# Patient Record
Sex: Female | Born: 1970 | Race: White | Hispanic: No
Health system: Southern US, Community
[De-identification: ages and names within clinical notes are randomized; demographics above are authoritative.]

---

## 2007-11-15 ENCOUNTER — Ambulatory Visit: Payer: Self-pay | Admitting: Family Medicine

## 2008-06-19 ENCOUNTER — Observation Stay: Payer: Self-pay | Admitting: Internal Medicine

## 2010-03-16 IMAGING — CR DG CHEST 2V
1 series · 2 of 2 positions shown · non-contrast
Comparison: none

REASON FOR EXAM: shortness of breath, asthma exac, ?pna
COMMENTS:

[Series 1: view not recorded · 0.17mm/px · 2 of 2 slices shown]
[im 1/2]
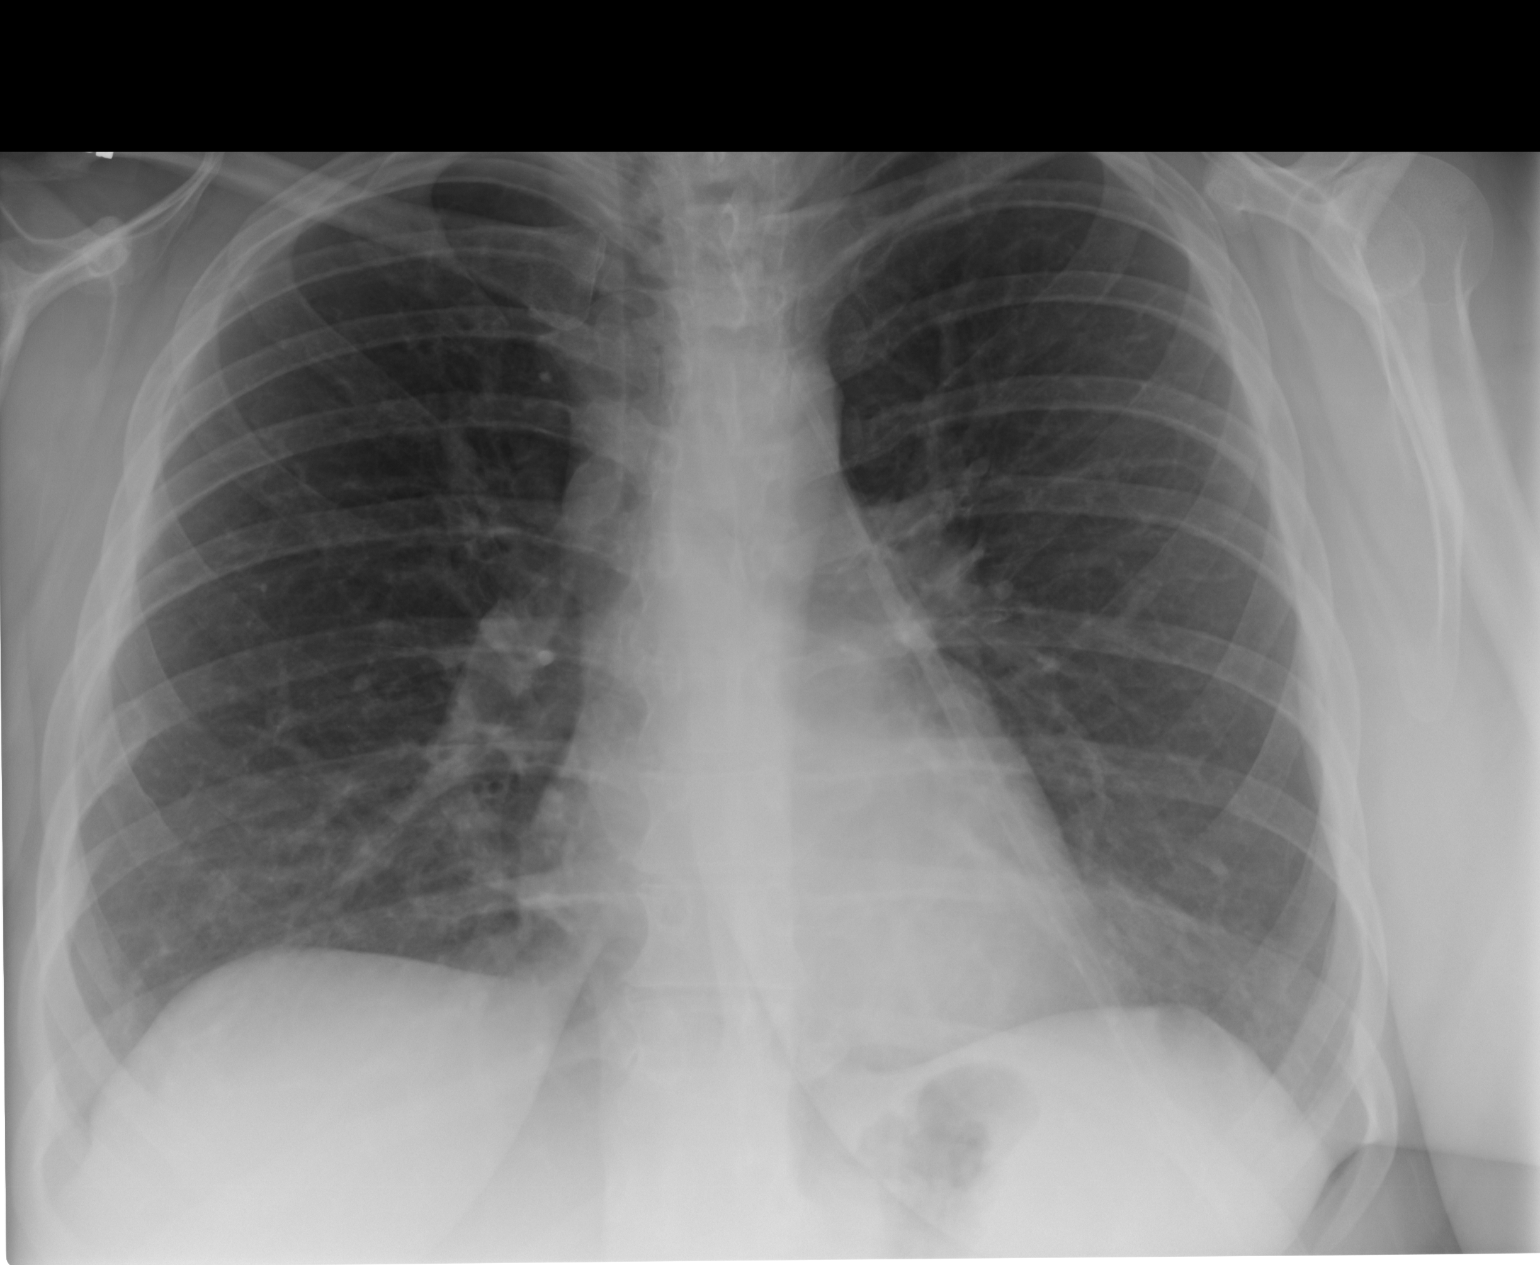
[im 2/2]
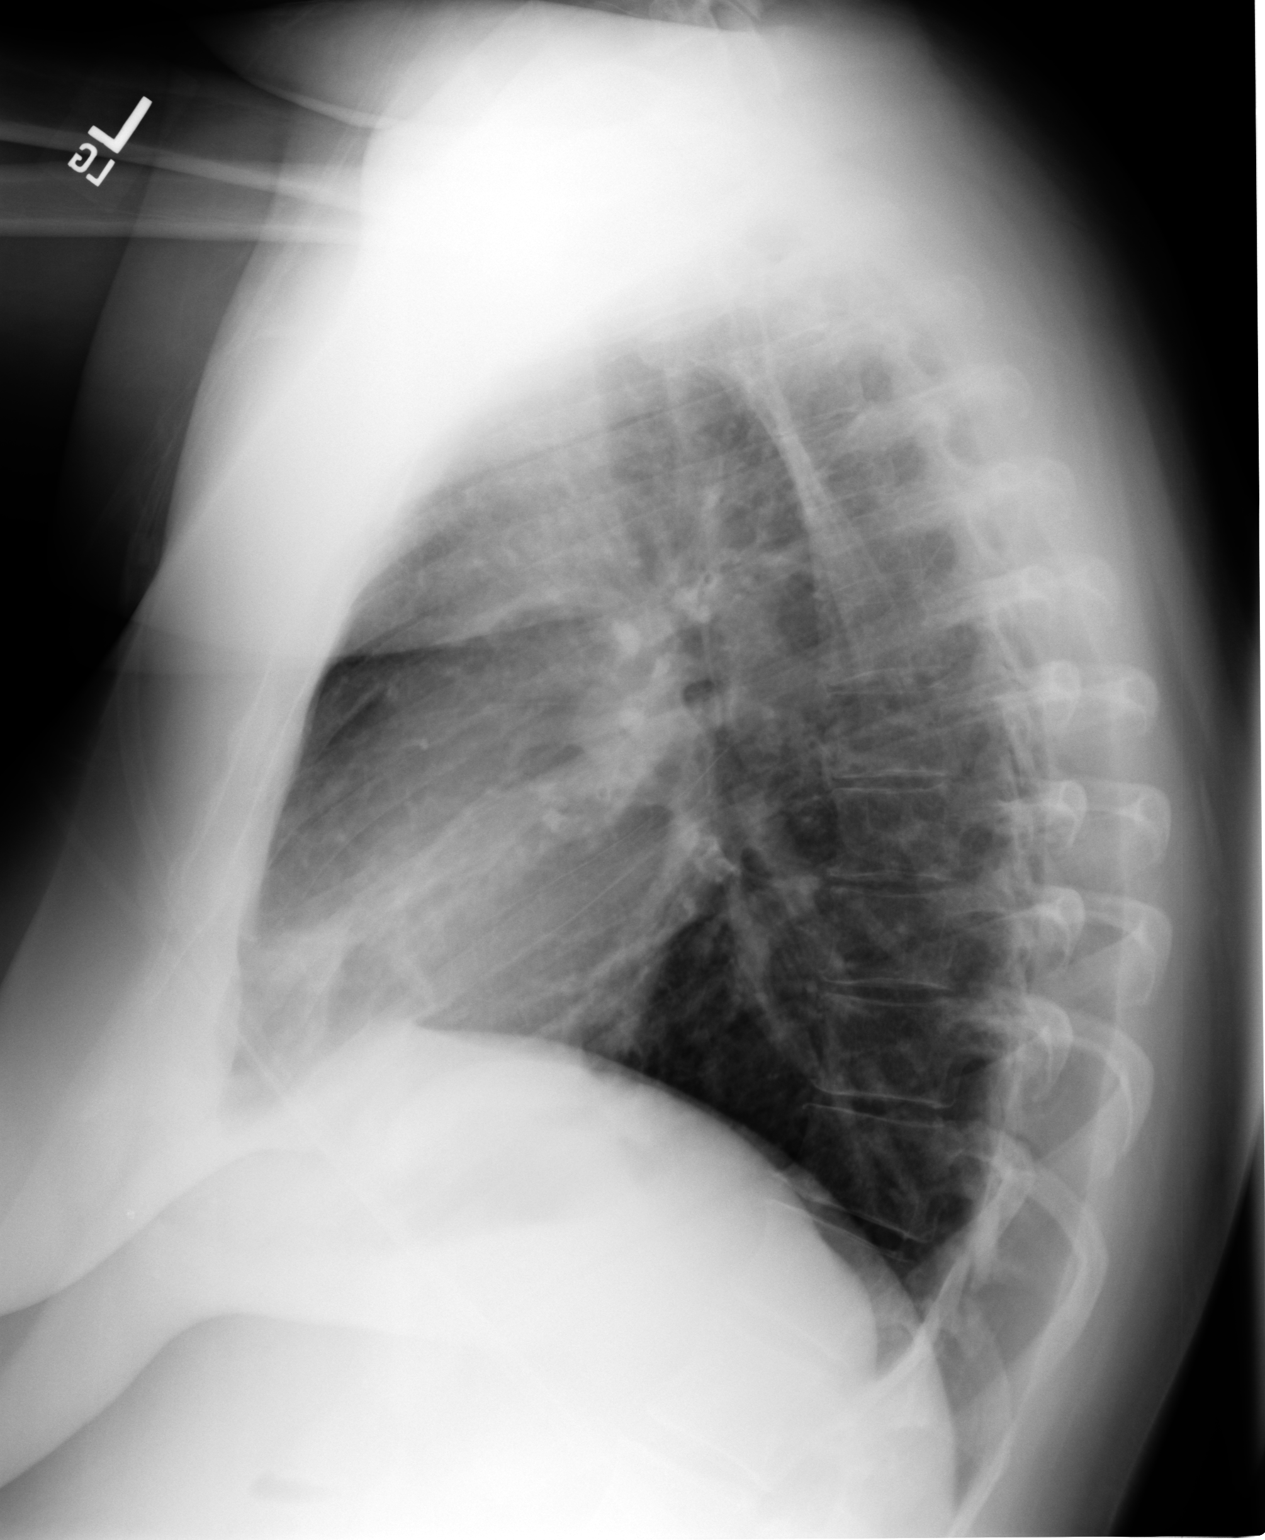

[2 of 2 positions shown; findings below may reference images not displayed]

PROCEDURE:     DXR - DXR CHEST PA (OR AP) AND LATERAL  - June 19, 2008  [DATE]

RESULT:     Comparison is made to the study 19 June, 2008 at [DATE] p.m.

The lungs are adequately inflated. There is density present likely
anteriorly in the lingula on the LEFT. There is no pleural effusion. The
heart is not enlarged and the pulmonary vascularity is not engorged. The
perihilar lung markings are mildly prominent but this is not a new finding
since earlier today.
IMPRESSION: There is atelectasis or infiltrate in the lingula. This is
superimposed upon findings which likely reflect reactive airway disease and
acute bronchitis. Follow-up films following therapy are recommended to
assure clearing.

## 2011-09-23 ENCOUNTER — Ambulatory Visit: Payer: Self-pay | Admitting: Oncology

## 2011-09-23 LAB — CBC CANCER CENTER
Eosinophil #: 0.3 x10 3/mm (ref 0.0–0.7)
Eosinophil %: 5.4 %
HGB: 12.9 g/dL (ref 12.0–16.0)
Lymphocyte #: 3.2 x10 3/mm (ref 1.0–3.6)
Lymphocyte %: 49.8 %
MCHC: 34 g/dL (ref 32.0–36.0)
MCV: 88 fL (ref 80–100)
Monocyte #: 0.4 x10 3/mm (ref 0.0–0.7)
Neutrophil %: 38.4 %
Platelet: 268 x10 3/mm (ref 150–440)
RBC: 4.32 10*6/uL (ref 3.80–5.20)
RDW: 13.4 % (ref 11.5–14.5)
WBC: 6.4 x10 3/mm (ref 3.6–11.0)

## 2011-09-26 ENCOUNTER — Ambulatory Visit: Payer: Self-pay | Admitting: Oncology

## 2011-10-07 LAB — HCG, QUANTITATIVE, PREGNANCY: Beta Hcg, Quant.: <1 m[IU]/mL — ABNORMAL LOW

## 2011-10-24 ENCOUNTER — Ambulatory Visit: Payer: Self-pay | Admitting: Oncology

## 2013-07-03 IMAGING — CT CT NECK-CHEST-ABD-PELV W/ CM
2 series · 10 of 14 positions shown, 11 images · non-contrast
Comparison: none

REASON FOR EXAM: cutaneous lymphoma eval for symptomatic disease
COMMENTS:

PROCEDURE:     KCT - KCT NECK/CHEST/ABD/PELVIS WITH  - October 07, 2011 [DATE]
RESULT:
HISTORY: Lymphoma.
Comparison Study: No prior.

[Series 2: ch-ab-pel w 5.0 i40f 3 · axial · 0.83mm/px · z∈[-168,+487]mm · 8 of 169 slices shown, 9 images]
[im 19/169  soft-tissue]
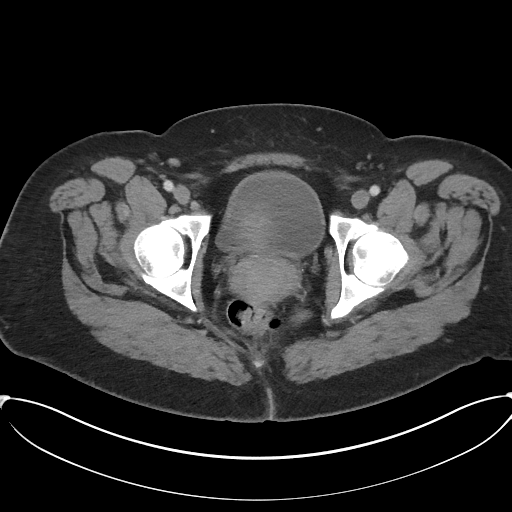
[im 19/169  bone]
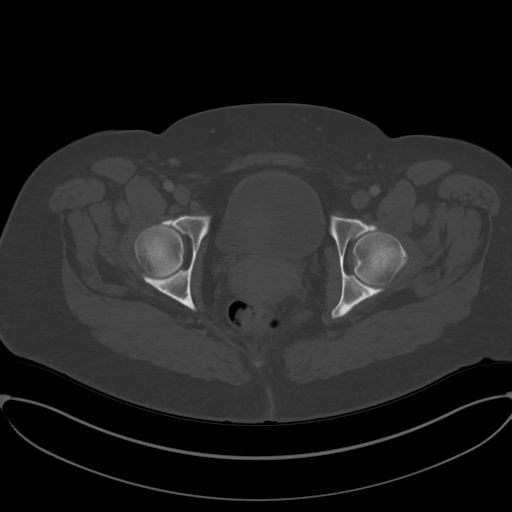
[im 38/169  soft-tissue]
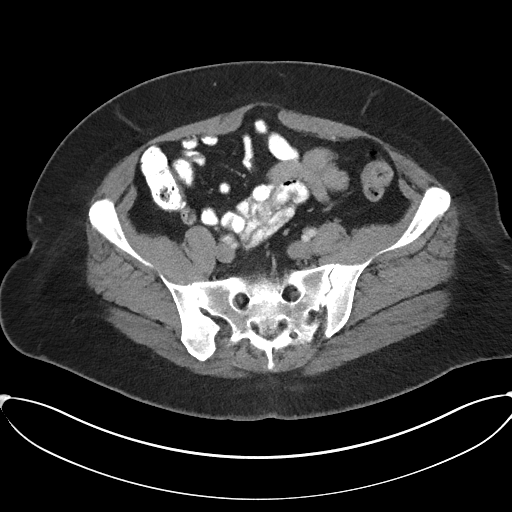
[im 57/169  soft-tissue]
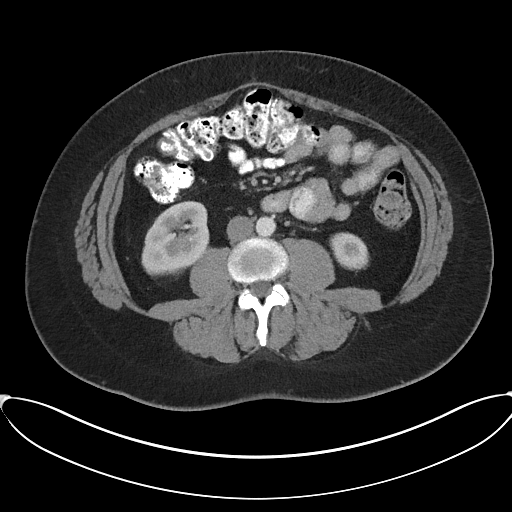
[im 75/169  soft-tissue]
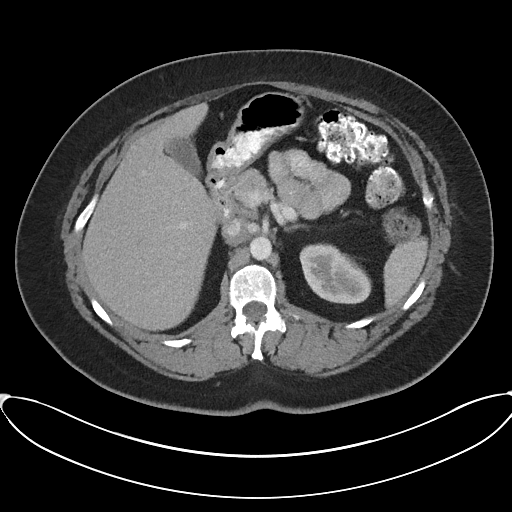
[im 94/169  soft-tissue]
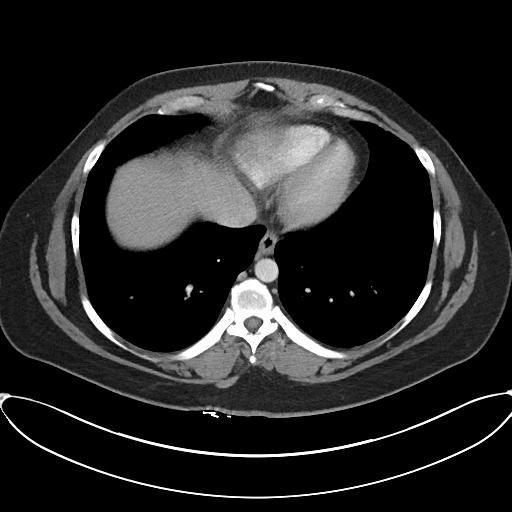
[im 113/169  soft-tissue]
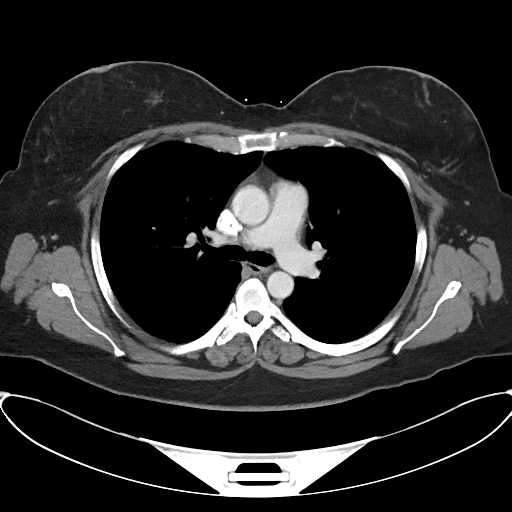
[im 131/169  soft-tissue]
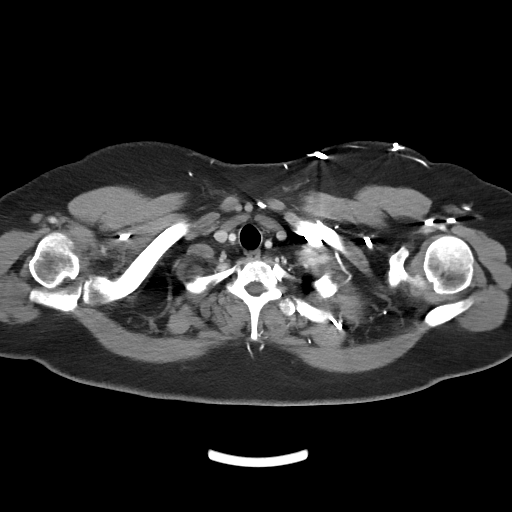
[im 150/169  soft-tissue]
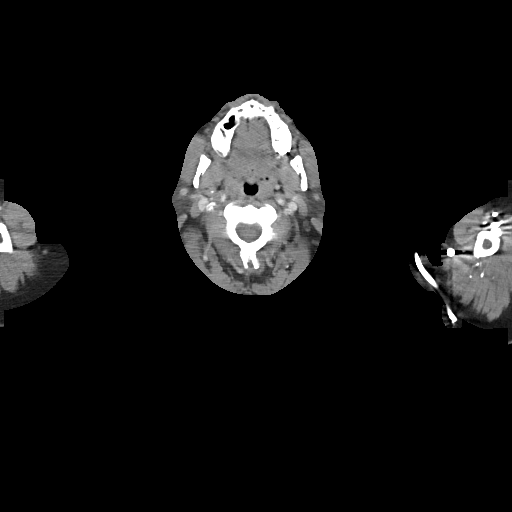

[Series 4: lung · axial · 0.83mm/px · z∈[+202,+307]mm · 2 of 63 slices shown]
[im 21/63  bone]
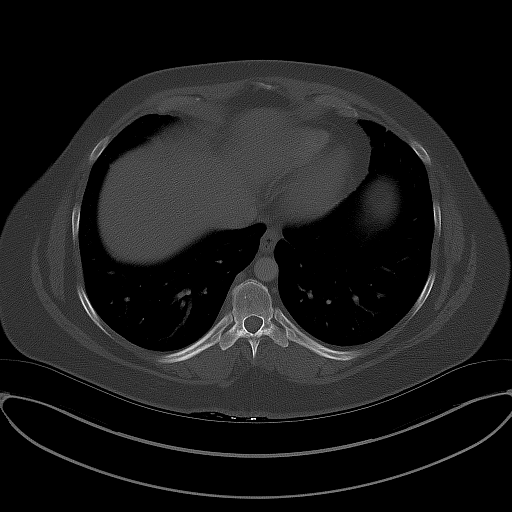
[im 42/63  bone]
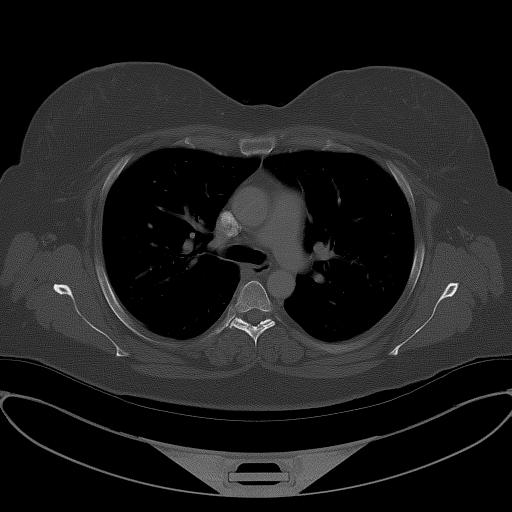

[10 of 14 positions shown; findings below may reference images not displayed]

FINDINGS: Standard CT was obtained with 100 mL of Fsovue-TNG. Paranasal
sinuses are clear. Tongue is normal. Parotid glands are normal.
Submandibular glands are normal. Thyroid is normal. The larynx is normal.
Anterior cervical jugular chain lymph nodes are noted measuring up to
cm. Thoracic aorta is unremarkable. Pulmonary arteries are unremarkable.
Large airways are patent. Lungs are clear. Lung CAD is nonspecific.
Parenchymal density is noted in the medial portion of the right breast.
Mammography is suggested for further evaluation.

The liver is normal. Spleen is normal. Pancreas is normal. Adrenals are
normal. Kidneys are normal. Aorta is nondistended. No evidence of
significant adenopathy. No bowel distention. Appendix is normal. There is a
3.7 cm left adnexal cystic lesion. This may be septated. Pelvic ultrasound
is suggested for further evaluation. The bladder is nondistended. Shotty
inguinal lymph nodes are noted.
IMPRESSION: 1. Tiny parenchymal density noted in the medial portion of the right breast.
This may represent normal breast tissue; however, mammography is suggested
for further evaluation.
2. Shotty cervical and inguinal lymph nodes. These are nonspecific.

## 2019-11-20 ENCOUNTER — Ambulatory Visit: Payer: Self-pay | Attending: Internal Medicine

## 2019-11-20 DIAGNOSIS — Z23 Encounter for immunization: Secondary | ICD-10-CM

## 2019-11-20 NOTE — Progress Notes (Signed)
   Covid-19 Vaccination Clinic  Name:  ANASTASSIA NOACK    MRN: [id number] DOB: 08/06/1971  11/20/2019  Ms. Dann was observed post Covid-19 immunization for 15 minutes without incident. She was provided with Vaccine Information Sheet and instruction to access the V-Safe system.   Ms. Morreale was instructed to call 911 with any severe reactions post vaccine: Marland Kitchen Difficulty breathing  . Swelling of face and throat  . A fast heartbeat  . A bad rash all over body  . Dizziness and weakness   Immunizations Administered    Name Date Dose VIS Date Route   Pfizer COVID-19 Vaccine 11/20/2019 11:05 AM 0.3 mL 08/05/2019 Intramuscular   Manufacturer: [employer], [employer]   Lot: [id number]   NDC: [address]-0

## 2019-12-14 ENCOUNTER — Ambulatory Visit: Payer: Self-pay | Attending: Internal Medicine

## 2019-12-14 DIAGNOSIS — Z23 Encounter for immunization: Secondary | ICD-10-CM

## 2019-12-14 NOTE — Progress Notes (Signed)
   Covid-19 Vaccination Clinic  Name:  Brittany Rowe    MRN: [id number] DOB: [birth year]  12/14/2019  [name] was observed post Covid-19 immunization for 15 minutes without incident. She was provided with Vaccine Information Sheet and instruction to access the V-Safe system.   [name] was instructed to call 911 with any severe reactions post vaccine: Marland Kitchen Difficulty breathing  . Swelling of face and throat  . A fast heartbeat  . A bad rash all over body  . Dizziness and weakness   Immunizations Administered    Name Date Dose VIS Date Route   Pfizer COVID-19 Vaccine 12/14/2019  4:20 PM 0.3 mL 10/19/2018 Intramuscular   Manufacturer: [employer], [employer]   Lot: [id number]   NDC: [address]-4

## 2023-09-27 ENCOUNTER — Ambulatory Visit
Admission: EM | Admit: 2023-09-27 | Discharge: 2023-09-27 | Disposition: A | Discharge status: Home / Self Care | Payer: 59

## 2023-09-27 ENCOUNTER — Encounter: Payer: Self-pay | Admitting: Emergency Medicine

## 2023-09-27 DIAGNOSIS — J4521 Mild intermittent asthma with (acute) exacerbation: Secondary | ICD-10-CM | POA: Diagnosis not present

## 2023-09-27 DIAGNOSIS — J069 Acute upper respiratory infection, unspecified: Secondary | ICD-10-CM

## 2023-09-27 LAB — POCT RAPID STREP A (OFFICE): Rapid Strep A Screen: NEGATIVE

## 2023-09-27 MED ORDER — PREDNISONE 10 MG (21) PO TBPK: ORAL_TABLET | Freq: Every day | ORAL | 0 refills | Status: AC

## 2023-09-27 MED ORDER — AMOXICILLIN-POT CLAVULANATE 875-125 MG PO TABS: 1.0000 | ORAL_TABLET | Freq: Two times a day (BID) | ORAL | 0 refills | Status: AC

## 2023-09-27 MED ORDER — PROMETHAZINE-DM 6.25-15 MG/5ML PO SYRP: 5.0000 mL | ORAL_SOLUTION | Freq: Every evening | ORAL | 0 refills | Status: AC | PRN

## 2023-09-27 MED ORDER — BENZONATATE 100 MG PO CAPS: 100.0000 mg | ORAL_CAPSULE | Freq: Three times a day (TID) | ORAL | 0 refills | Status: AC

## 2023-09-27 NOTE — ED Triage Notes (Signed)
Pt presents with a cough, sore throat, fatigue, nasal congestion x 1 week.

## 2023-09-27 NOTE — Discharge Instructions (Addendum)
Strep test is negative  Symptoms present for 7 days and you are beginning to experience new symptoms and is now affecting her asthma and therefore you will be placed on antibiotic  Begin Augmentin every morning and every evening for 7 days  Begin prednisone every morning as directed, take with food, this helps to open and relax the airway should help settle shortness of breath, wheezing and chest burning and discomfort  May use Tessalon pill every 8 hours as needed for coughing, may use cough syrup at bedtime for rest You can take Tylenol and/or Ibuprofen as needed for fever reduction and pain relief.   For cough: honey 1/2 to 1 teaspoon (you can dilute the honey in water or another fluid).  You can also use guaifenesin and dextromethorphan for cough. You can use a humidifier for chest congestion and cough.  If you don't have a humidifier, you can sit in the bathroom with the hot shower running.      For sore throat: try warm salt water gargles, cepacol lozenges, throat spray, warm tea or water with lemon/honey, popsicles or ice, or OTC cold relief medicine for throat discomfort.   For congestion: take a daily anti-histamine like Zyrtec, Claritin, and a oral decongestant, such as pseudoephedrine.  You can also use Flonase 1-2 sprays in each nostril daily.   It is important to stay hydrated: drink plenty of fluids (water, gatorade/powerade/pedialyte, juices, or teas) to keep your throat moisturized and help further relieve irritation/discomfort.
